# Patient Record
Sex: Male | Born: 1990 | Race: White | Hispanic: No | Marital: Married
Health system: Southern US, Community
[De-identification: ages and names within clinical notes are randomized; demographics above are authoritative.]

## PROBLEM LIST (undated history)

## (undated) DIAGNOSIS — R809 Proteinuria, unspecified: Secondary | ICD-10-CM

---
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
## 2010-11-11 ENCOUNTER — Emergency Department: Payer: Self-pay | Admitting: Emergency Medicine

## 2012-10-11 IMAGING — CT CT HEAD WITHOUT CONTRAST
2 series · 16 of 30 positions shown, 20 images · non-contrast
Comparison: none

REASON FOR EXAM: dizzy  confusion
COMMENTS:

PROCEDURE:     CT  - CT HEAD WITHOUT CONTRAST  - November 11, 2010 [DATE]
RESULT:     Comparison:  None
TECHNIQUE: Multiple axial images from the foramen magnum to the vertex were
obtained without IV contrast.

[Series 2: without · axial · non-contrast · 0.44mm/px · z∈[+417,+552]mm · 13 of 33 slices shown, 17 images]
[im 3/33  brain]
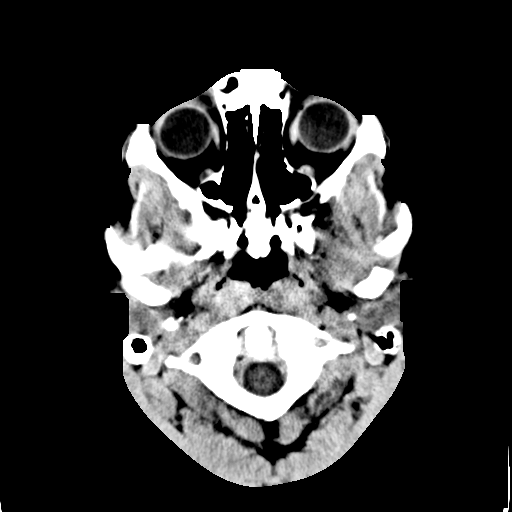
[im 3/33  bone]
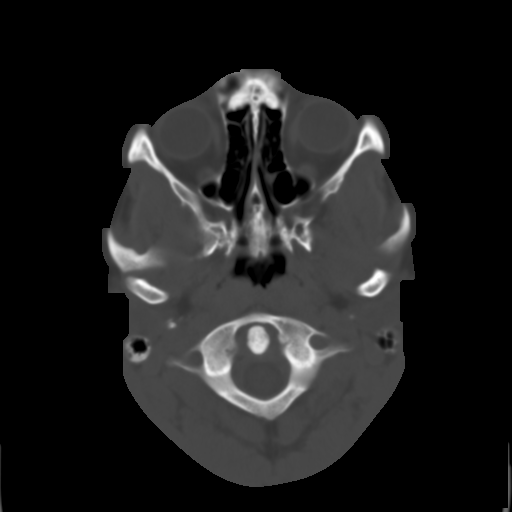
[im 5/33  brain]
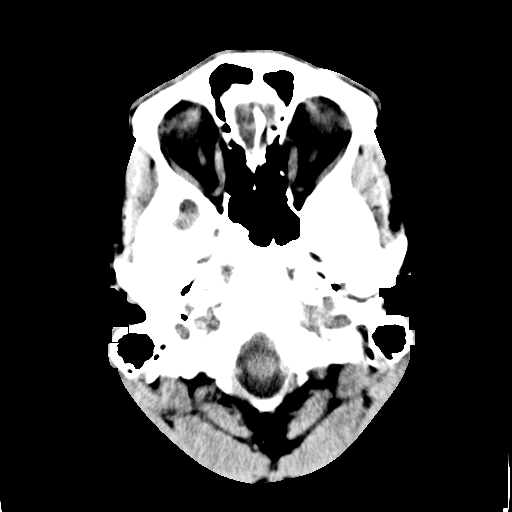
[im 7/33  brain]
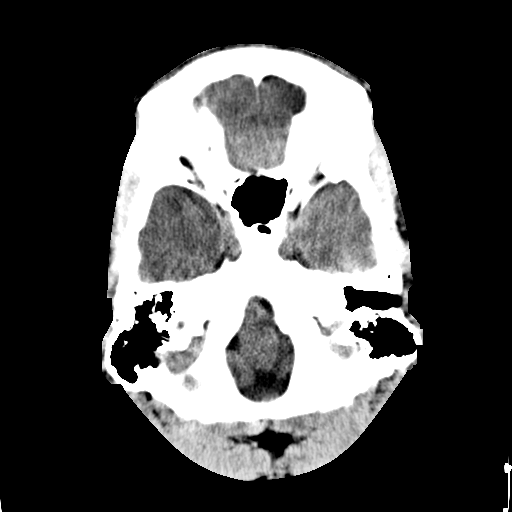
[im 10/33  brain]
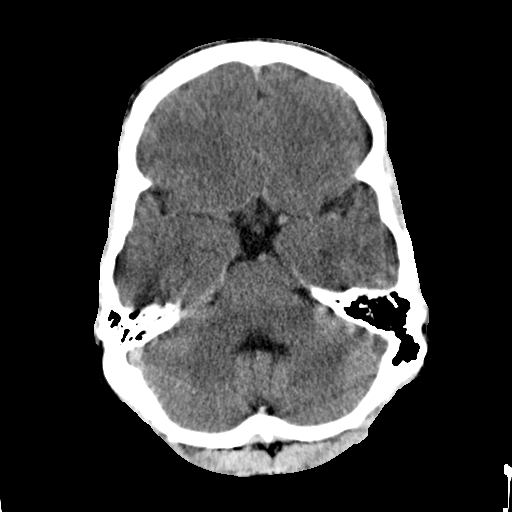
[im 12/33  brain]
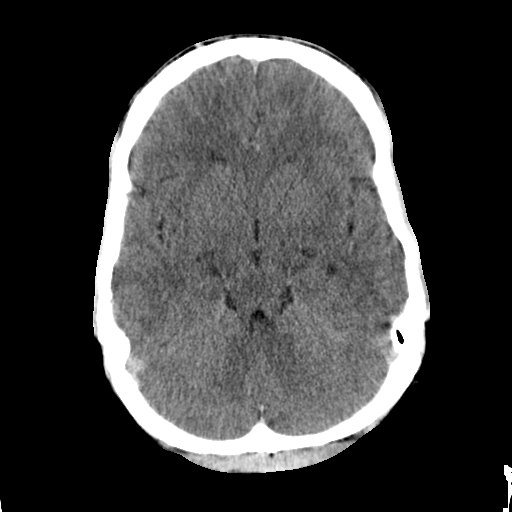
[im 12/33  bone]
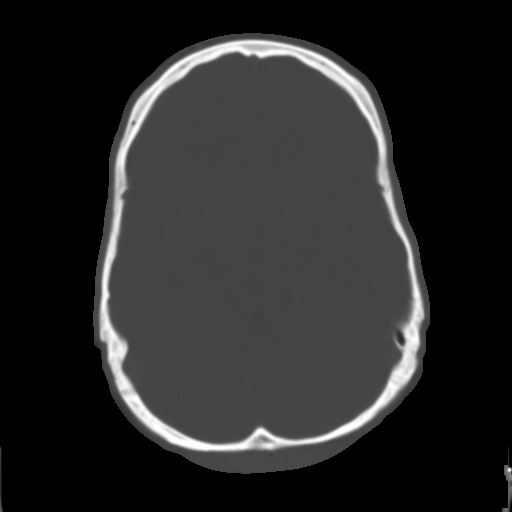
[im 14/33  brain]
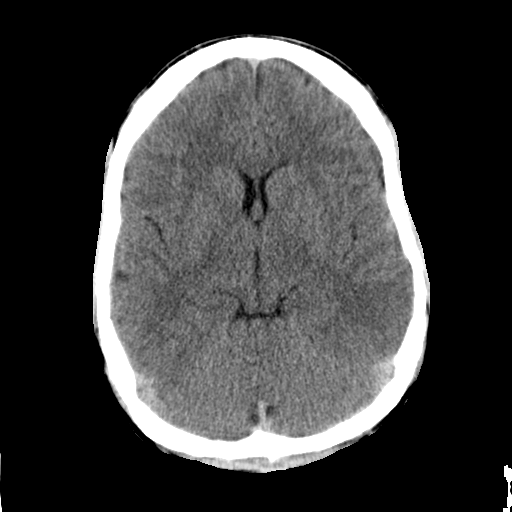
[im 17/33  brain]
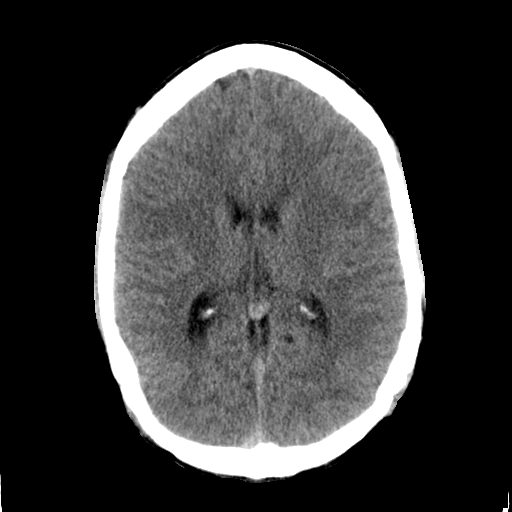
[im 19/33  brain]
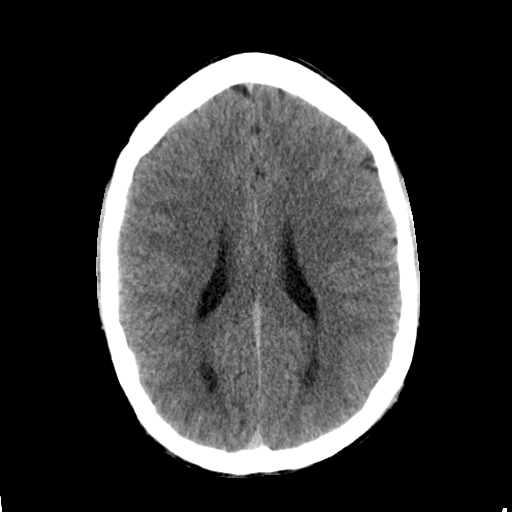
[im 21/33  brain]
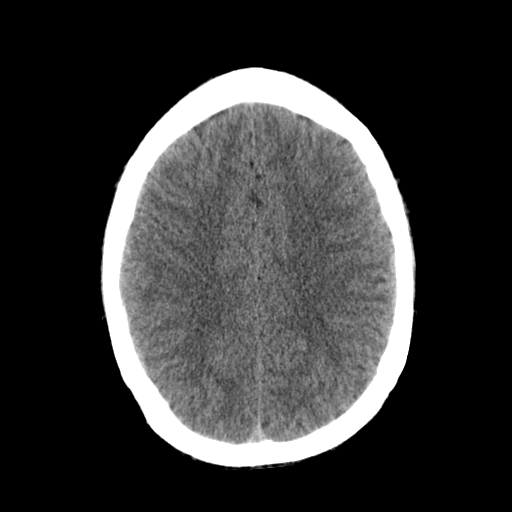
[im 21/33  bone]
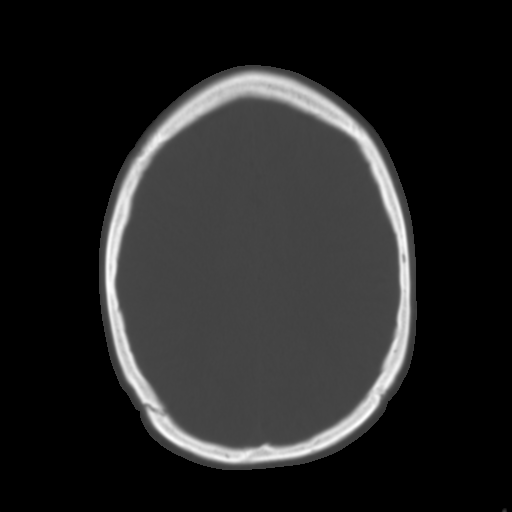
[im 23/33  brain]
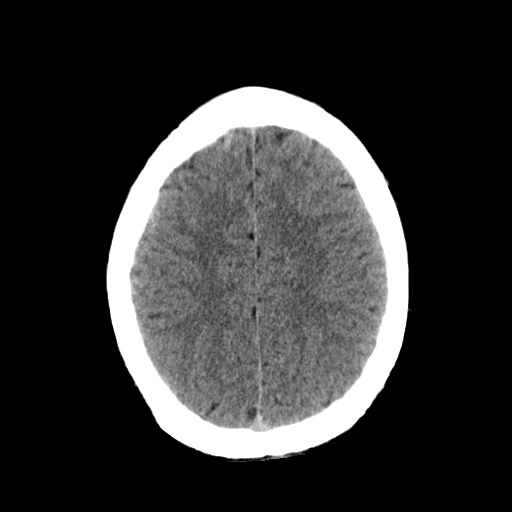
[im 26/33  brain]
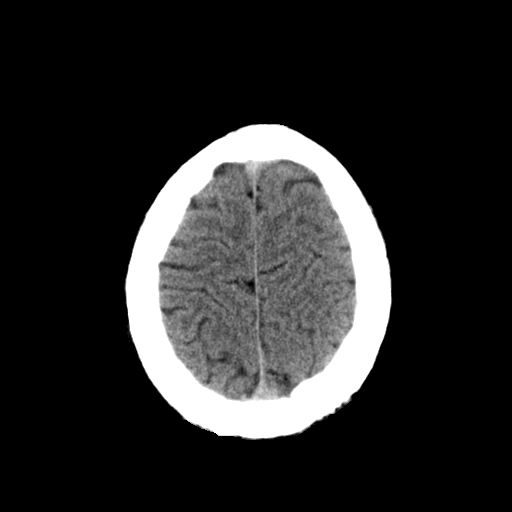
[im 28/33  brain]
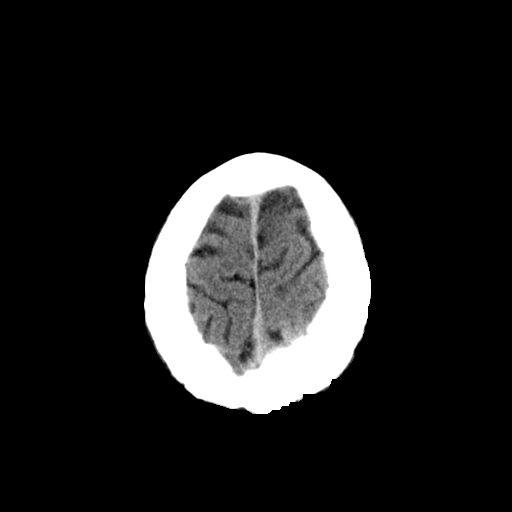
[im 30/33  brain]
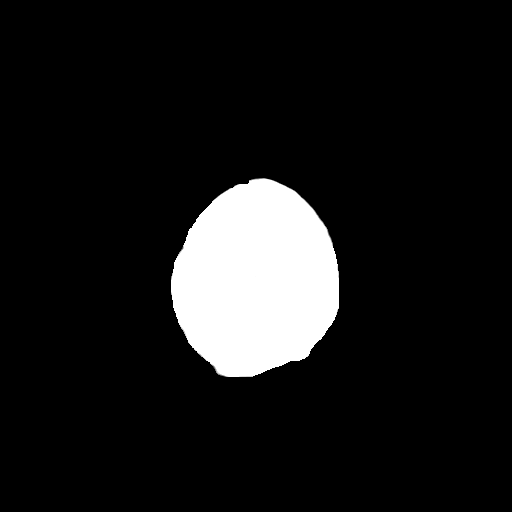
[im 30/33  bone]
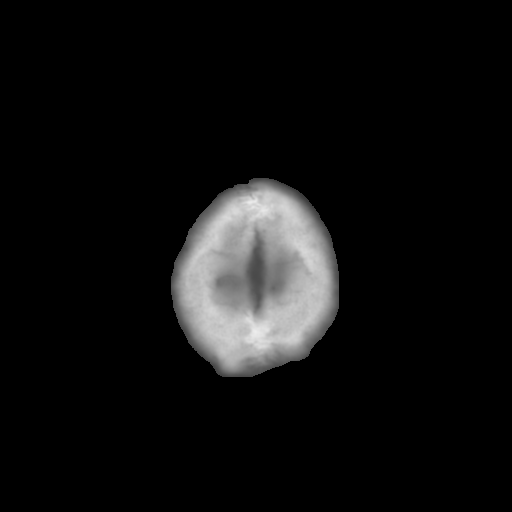

[Series 3: bone · axial · 0.44mm/px · z∈[+417,+462]mm · 3 of 33 slices shown]
[im 3/33  bone]
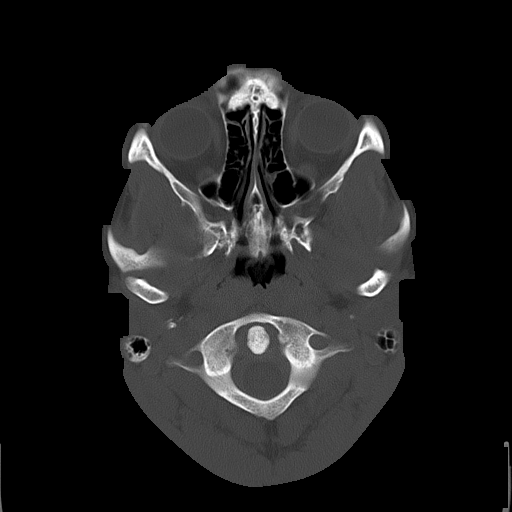
[im 7/33  bone]
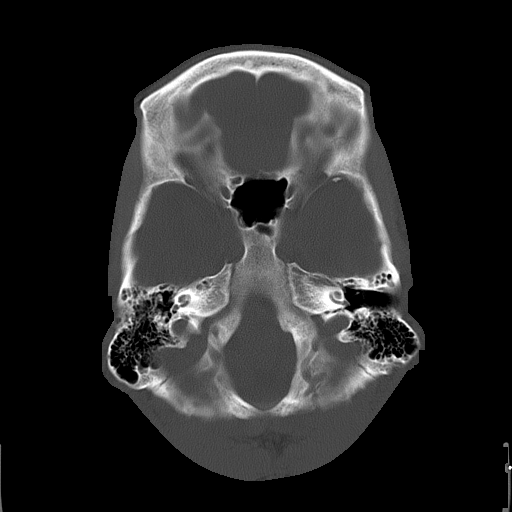
[im 12/33  bone]
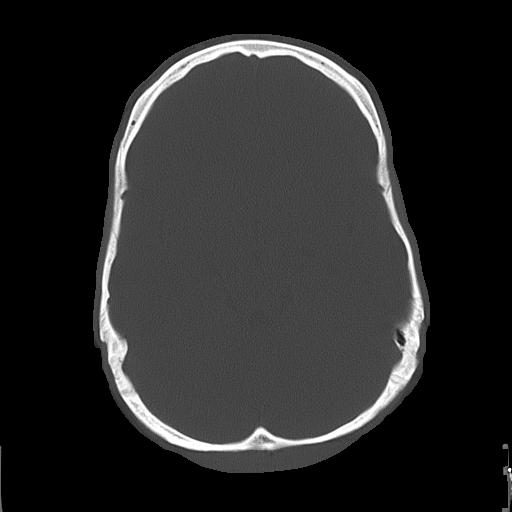

[16 of 30 positions shown; findings below may reference images not displayed]

FINDINGS: There is no evidence for mass effect, midline shift, or extra-axial fluid
collections. There is no evidence for space-occupying lesion, intracranial
hemorrhage, or cortical-based area of infarction.

The osseous structures are unremarkable.
IMPRESSION: No acute intracranial process.

## 2019-01-29 ENCOUNTER — Ambulatory Visit: Payer: Self-pay | Attending: Internal Medicine

## 2019-01-29 DIAGNOSIS — Z20822 Contact with and (suspected) exposure to covid-19: Secondary | ICD-10-CM | POA: Insufficient documentation

## 2019-01-30 LAB — NOVEL CORONAVIRUS, NAA: SARS-CoV-2, NAA: NOT DETECTED

## 2019-01-31 ENCOUNTER — Telehealth: Payer: Self-pay

## 2019-01-31 NOTE — Telephone Encounter (Signed)
Pt notified of negative COVID-19 results. Understanding verbalized.  Chasta M Hopkins   

## 2020-09-25 ENCOUNTER — Other Ambulatory Visit
Admission: RE | Admit: 2020-09-25 | Discharge: 2020-09-25 | Disposition: A | Discharge status: Home / Self Care | Payer: 59 | Attending: Nephrology | Admitting: Nephrology

## 2020-09-25 DIAGNOSIS — R809 Proteinuria, unspecified: Secondary | ICD-10-CM | POA: Diagnosis present

## 2020-09-25 DIAGNOSIS — R319 Hematuria, unspecified: Secondary | ICD-10-CM | POA: Insufficient documentation

## 2020-09-25 DIAGNOSIS — E7800 Pure hypercholesterolemia, unspecified: Secondary | ICD-10-CM | POA: Diagnosis not present

## 2020-09-25 DIAGNOSIS — Q8781 Alport syndrome: Secondary | ICD-10-CM | POA: Diagnosis not present

## 2020-09-25 DIAGNOSIS — M10071 Idiopathic gout, right ankle and foot: Secondary | ICD-10-CM | POA: Diagnosis not present

## 2020-09-25 DIAGNOSIS — Z7189 Other specified counseling: Secondary | ICD-10-CM | POA: Insufficient documentation

## 2020-09-25 LAB — ABO/RH: ABO/RH(D): O POS

## 2020-09-30 ENCOUNTER — Other Ambulatory Visit (HOSPITAL_COMMUNITY): Payer: Self-pay | Admitting: Nephrology

## 2020-09-30 ENCOUNTER — Other Ambulatory Visit: Payer: Self-pay | Admitting: Nephrology

## 2020-09-30 DIAGNOSIS — Q8781 Alport syndrome: Secondary | ICD-10-CM

## 2020-09-30 DIAGNOSIS — R809 Proteinuria, unspecified: Secondary | ICD-10-CM

## 2020-10-06 ENCOUNTER — Ambulatory Visit: Payer: 59

## 2020-10-08 ENCOUNTER — Other Ambulatory Visit: Payer: Self-pay

## 2020-10-08 ENCOUNTER — Ambulatory Visit
Admission: RE | Admit: 2020-10-08 | Discharge: 2020-10-08 | Disposition: A | Discharge status: Home / Self Care | Payer: 59 | Source: Ambulatory Visit | Attending: Nephrology | Admitting: Nephrology

## 2020-10-08 DIAGNOSIS — R809 Proteinuria, unspecified: Secondary | ICD-10-CM | POA: Insufficient documentation

## 2020-10-08 DIAGNOSIS — Q8781 Alport syndrome: Secondary | ICD-10-CM | POA: Insufficient documentation

## 2020-10-13 ENCOUNTER — Other Ambulatory Visit: Payer: Self-pay | Admitting: Nephrology

## 2020-10-13 DIAGNOSIS — Q8781 Alport syndrome: Secondary | ICD-10-CM

## 2020-10-13 DIAGNOSIS — R809 Proteinuria, unspecified: Secondary | ICD-10-CM

## 2020-10-22 ENCOUNTER — Other Ambulatory Visit: Payer: Self-pay | Admitting: Radiology

## 2020-10-23 ENCOUNTER — Other Ambulatory Visit: Payer: Self-pay

## 2020-10-23 ENCOUNTER — Ambulatory Visit: Payer: 59

## 2020-10-23 ENCOUNTER — Ambulatory Visit
Admission: RE | Admit: 2020-10-23 | Discharge: 2020-10-23 | Disposition: A | Discharge status: Home / Self Care | Payer: 59 | Source: Ambulatory Visit | Attending: Nephrology | Admitting: Nephrology

## 2020-10-23 DIAGNOSIS — R809 Proteinuria, unspecified: Secondary | ICD-10-CM | POA: Insufficient documentation

## 2020-10-23 DIAGNOSIS — Z881 Allergy status to other antibiotic agents status: Secondary | ICD-10-CM | POA: Insufficient documentation

## 2020-10-23 DIAGNOSIS — Q8781 Alport syndrome: Secondary | ICD-10-CM | POA: Insufficient documentation

## 2020-10-23 HISTORY — DX: Proteinuria, unspecified: R80.9

## 2020-10-23 LAB — CBC
HCT: 40.2 % (ref 39.0–52.0)
Hemoglobin: 14.4 g/dL (ref 13.0–17.0)
MCH: 30.6 pg (ref 26.0–34.0)
MCHC: 35.8 g/dL (ref 30.0–36.0)
MCV: 85.5 fL (ref 80.0–100.0)
Platelets: 199 10*3/uL (ref 150–400)
RBC: 4.7 MIL/uL (ref 4.22–5.81)
RDW: 12.5 % (ref 11.5–15.5)
WBC: 6 10*3/uL (ref 4.0–10.5)
nRBC: 0 % (ref 0.0–0.2)

## 2020-10-23 LAB — PROTIME-INR
INR: 1 (ref 0.8–1.2)
Prothrombin Time: 13.2 seconds (ref 11.4–15.2)

## 2020-10-23 MED ORDER — MIDAZOLAM HCL 2 MG/2ML IJ SOLN
INTRAMUSCULAR | Status: AC
  Filled 2020-10-23: qty 2

## 2020-10-23 MED ORDER — FENTANYL CITRATE (PF) 100 MCG/2ML IJ SOLN
INTRAMUSCULAR | Status: DC | PRN
  Administered 2020-10-23: 50 ug via INTRAVENOUS

## 2020-10-23 MED ORDER — MIDAZOLAM HCL 2 MG/2ML IJ SOLN
INTRAMUSCULAR | Status: DC | PRN
  Administered 2020-10-23: 1 mg via INTRAVENOUS

## 2020-10-23 MED ORDER — MIDAZOLAM HCL 5 MG/5ML IJ SOLN
INTRAMUSCULAR | Status: DC | PRN
  Administered 2020-10-23: 1 mg via INTRAVENOUS

## 2020-10-23 MED ORDER — SODIUM CHLORIDE 0.9 % IV SOLN: INTRAVENOUS | Status: DC

## 2020-10-23 MED ORDER — FENTANYL CITRATE (PF) 100 MCG/2ML IJ SOLN
INTRAMUSCULAR | Status: AC
  Filled 2020-10-23: qty 2

## 2020-10-23 NOTE — Procedures (Addendum)
Interventional Radiology Procedure Note  Date of Procedure: 10/23/2020  Procedure: US guided random renal biopsy   Findings:  1. Random renal biopsy, LEFT kidney, 16ga x2 passes   2. Gelfoam slurry administered for hemostasis   Complications: No immediate complications noted.   Estimated Blood Loss: minimal  Follow-up and Recommendations: 1. Bedrest 3 hours    Olive Bass, MD  Vascular & Interventional Radiology  10/23/2020 8:40 AM

## 2020-10-23 NOTE — Progress Notes (Signed)
Patient tolerating po water intake without complaint of nausea. Will continue to monitor. VS WDL at this time.

## 2020-10-23 NOTE — H&P (Signed)
Interventional Radiology - Preprocedure H&P    Referring Provider: Mosetta Pigeon, MD  History of Present Illness  Jason Tucker is a 30 y.o. male with a relevant past medical history of proteinuria seen today for US guided random renal biopsy.  He is in his usual state of health. No other medical problems.     Additional Past Medical History Past Medical History:  Diagnosis Date   Proteinuria, unspecified     Medications  I have reviewed the current medication list. Refer to chart for details. No current outpatient medications    Allergies Allergies  Allergen Reactions   Doxycycline     Patient states he had it as a baby but doesn't recall what happened when he took it    Physical Exam Current Vitals Temp: 98.3 F (36.8 C) (Temp Source: Oral)  Pulse Rate: 64  Resp: 15  BP: 129/76  SpO2: 100 %        There is no height or weight on file to calculate BMI.  General: Alert and answers questions appropriately. No apparent distress. HEENT: Normocephalic, atraumatic. Conjunctivae normal without scleral icterus. Mallampati score: I (soft palate, uvula, fauces, and tonsillar pillars visible) Cardiac: Regular rate and rhythm. No dependent edema. Pulmonary: Normal work of breathing. On room air. Abdominal: Soft without distension. Extremities: Normally-formed, well perfused.    Pertinent Lab Results Labs: CBC:    BMP:   Coagulation: PT/INR/PTT:  --/1.0/-- (10/07 0742)  CBC Trends: No results for input(s): WBC, HGB, HCT, PLT in the last 72 hours.   Creatinine Trend: No results for input(s): CREATININE in the last 72 hours.   Relevant and/or Recent Imaging: Recent renal US reviewed    Assessment & Plan Jason Tucker is a 30 y.o. male seen today for US guided renal biopsy in the setting of proteinuria.  He is appropriate for procedure and agreeable to proceed after discussion of risks and benefits, including bleeding after the procedure.    Procedure  Checklist:  Consent obtained: Risks of the procedure as well as the alternatives and risks of each were explained to the patient and/or caregiver.  Consent for the procedure was obtained and is signed in the bedside chart Consent obtained from: The patient Patient is appropriate candidate for sedation Yes ASA Classification: ASA 1 - Normal health patient NPO status: 0000  Code status:   Code Status: Not on file Pre-procedural prep necessary: none      I spent a total of 15 Minutes in face-to-face in clinical consultation, greater than 50% of which was spent on medical decision-making and counseling/coordinating care for kidney biopsy.     Olive Bass, MD  Vascular and Interventional Radiology 10/23/2020 8:07 AM

## 2020-10-30 ENCOUNTER — Ambulatory Visit: Payer: 59

## 2020-11-05 ENCOUNTER — Encounter: Payer: Self-pay | Admitting: Nephrology

## 2020-11-05 LAB — SURGICAL PATHOLOGY

## 2021-05-11 DIAGNOSIS — R809 Proteinuria, unspecified: Secondary | ICD-10-CM | POA: Diagnosis not present

## 2021-05-11 DIAGNOSIS — N041 Nephrotic syndrome with focal and segmental glomerular lesions: Secondary | ICD-10-CM | POA: Diagnosis not present

## 2021-05-11 DIAGNOSIS — E790 Hyperuricemia without signs of inflammatory arthritis and tophaceous disease: Secondary | ICD-10-CM | POA: Diagnosis not present

## 2021-05-11 DIAGNOSIS — Q8781 Alport syndrome: Secondary | ICD-10-CM | POA: Diagnosis not present

## 2021-05-11 DIAGNOSIS — E785 Hyperlipidemia, unspecified: Secondary | ICD-10-CM | POA: Diagnosis not present

## 2021-06-09 DIAGNOSIS — Q8781 Alport syndrome: Secondary | ICD-10-CM | POA: Diagnosis not present

## 2021-06-09 DIAGNOSIS — R030 Elevated blood-pressure reading, without diagnosis of hypertension: Secondary | ICD-10-CM | POA: Diagnosis not present

## 2021-06-09 DIAGNOSIS — E7800 Pure hypercholesterolemia, unspecified: Secondary | ICD-10-CM | POA: Diagnosis not present

## 2021-07-09 DIAGNOSIS — K529 Noninfective gastroenteritis and colitis, unspecified: Secondary | ICD-10-CM | POA: Diagnosis not present

## 2021-07-09 DIAGNOSIS — R197 Diarrhea, unspecified: Secondary | ICD-10-CM | POA: Diagnosis not present

## 2022-01-05 DIAGNOSIS — E785 Hyperlipidemia, unspecified: Secondary | ICD-10-CM | POA: Diagnosis not present

## 2022-01-05 DIAGNOSIS — I10 Essential (primary) hypertension: Secondary | ICD-10-CM | POA: Diagnosis not present

## 2022-09-01 DIAGNOSIS — M10071 Idiopathic gout, right ankle and foot: Secondary | ICD-10-CM | POA: Diagnosis not present

## 2022-09-01 DIAGNOSIS — E7800 Pure hypercholesterolemia, unspecified: Secondary | ICD-10-CM | POA: Diagnosis not present

## 2022-09-08 IMAGING — US US RENAL
1 series · 14 of 25 positions shown · non-contrast
Comparison: Prior ultrasound from 04/09/2004.

CLINICAL DATA: Initial evaluation for proteinuria, history of
Alport syndrome.

EXAM:
RENAL / URINARY TRACT ULTRASOUND COMPLETE

[Series 1: us renal · 0.23mm/px · 14 of 27 slices shown]
[im 1/27]
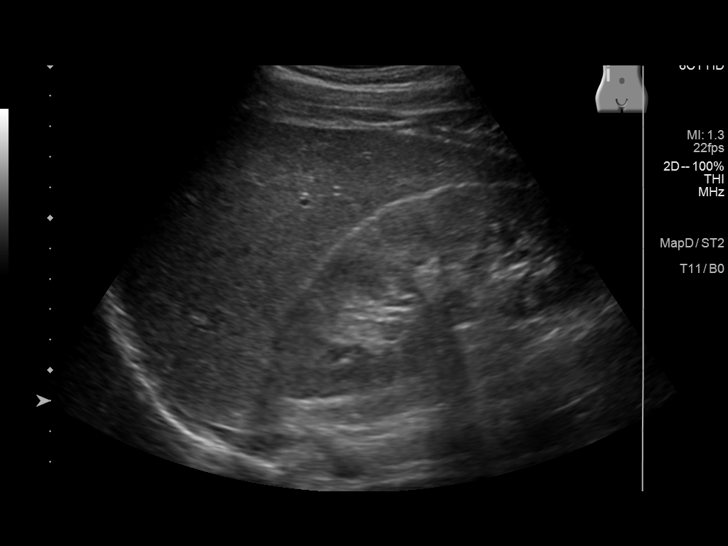
[im 3/27]
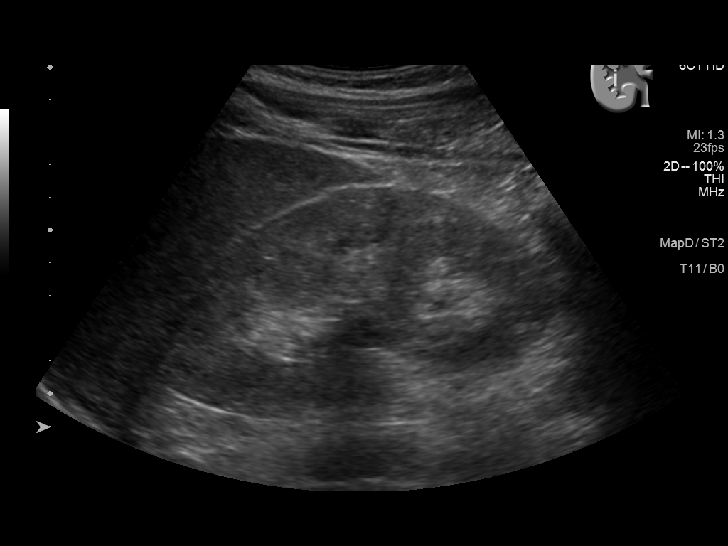
[im 5/27]
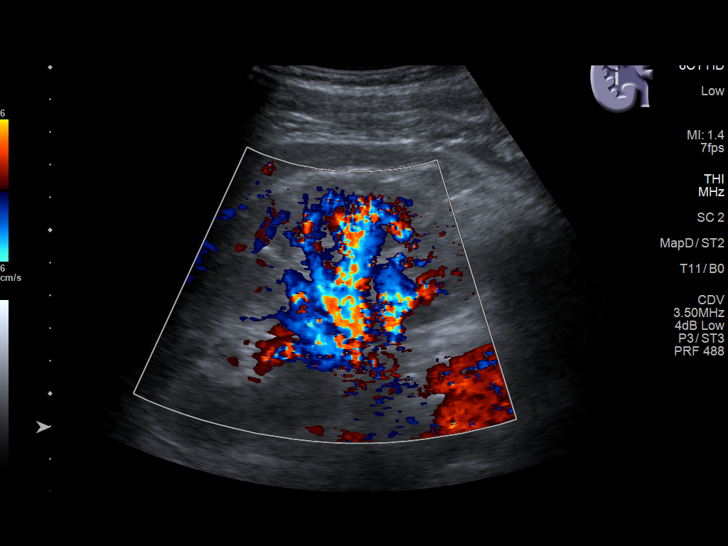
[im 7/27]
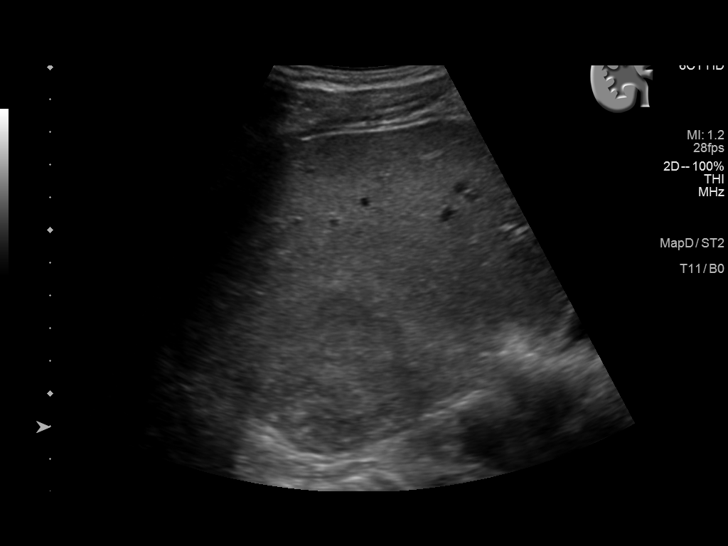
[im 9/27]
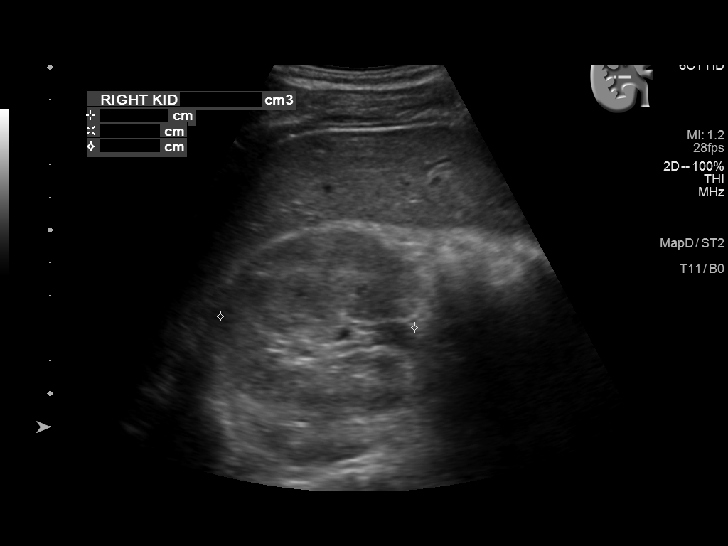
[im 10/27]
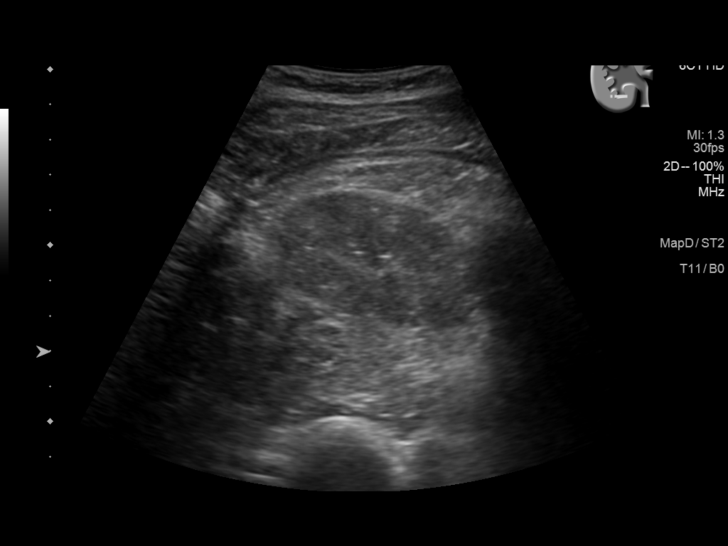
[im 12/27]
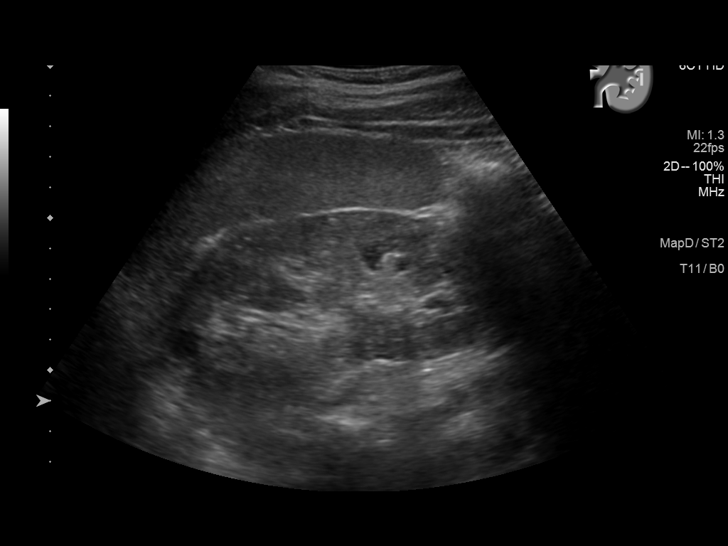
[im 15/27]
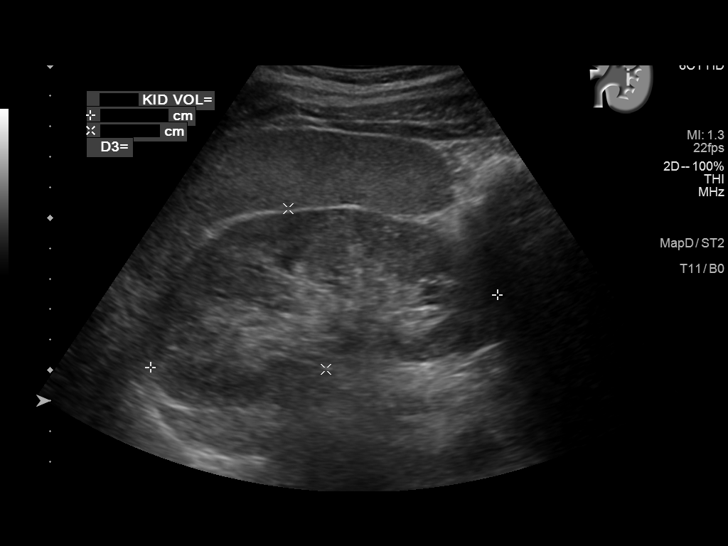
[im 17/27]
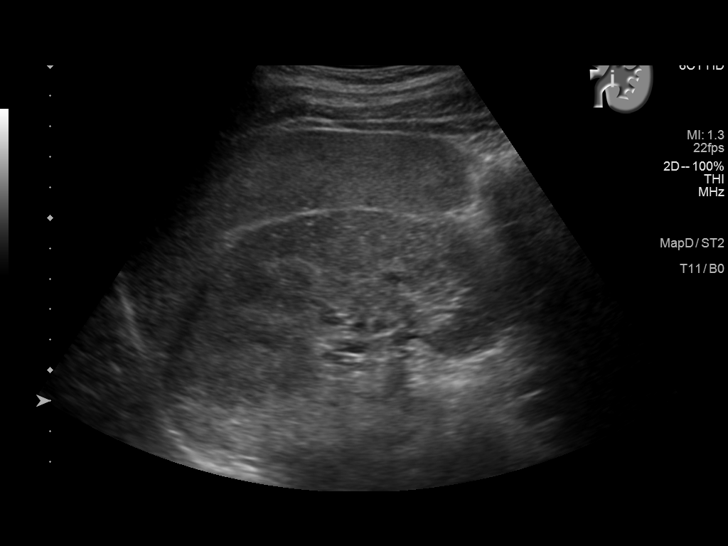
[im 18/27]
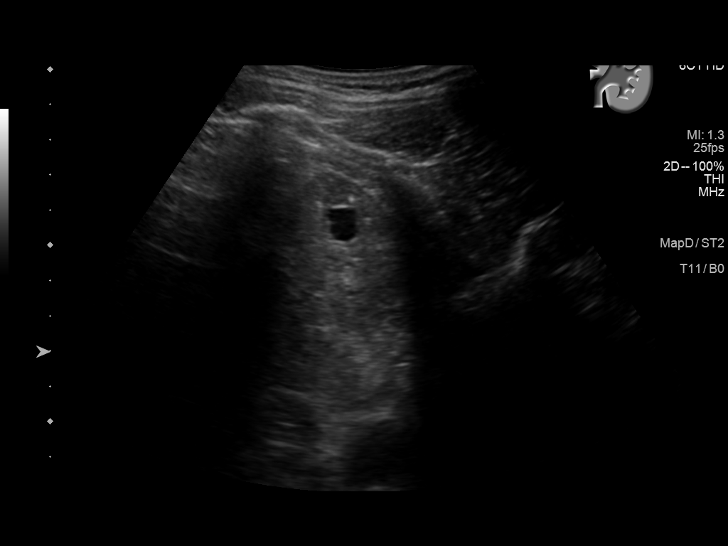
[im 20/27]
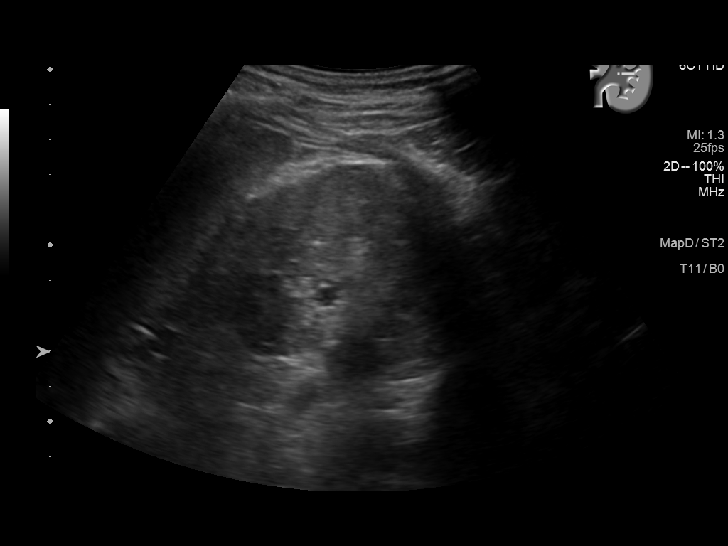
[im 22/27]
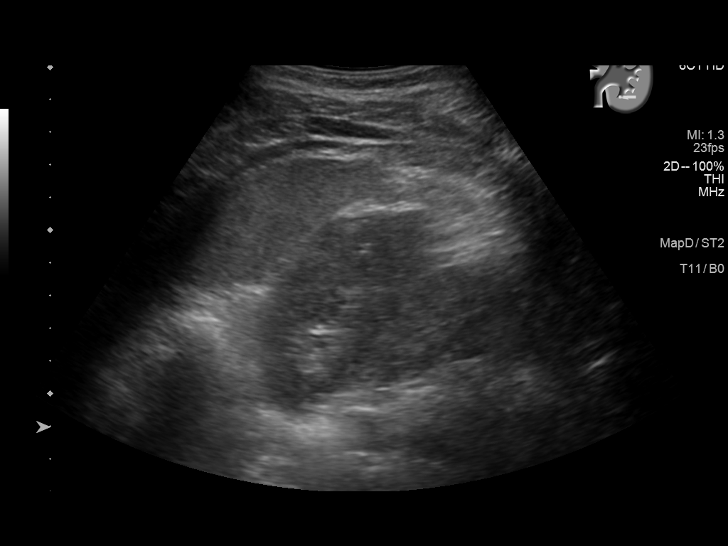
[im 24/27]
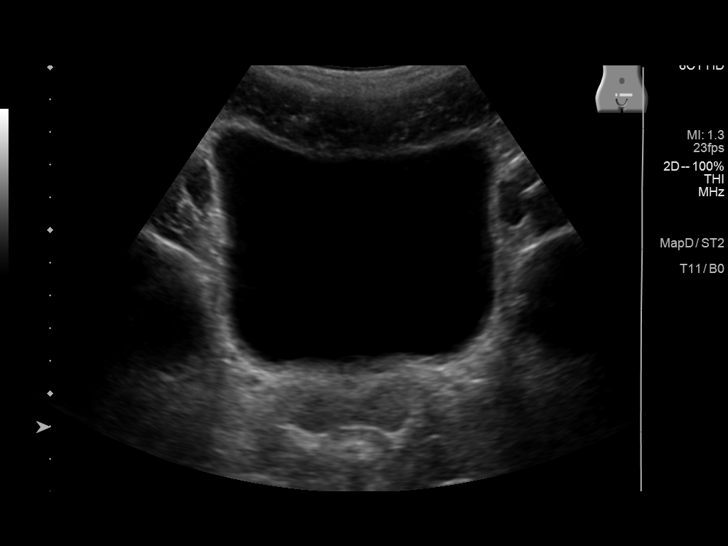
[im 27/27]
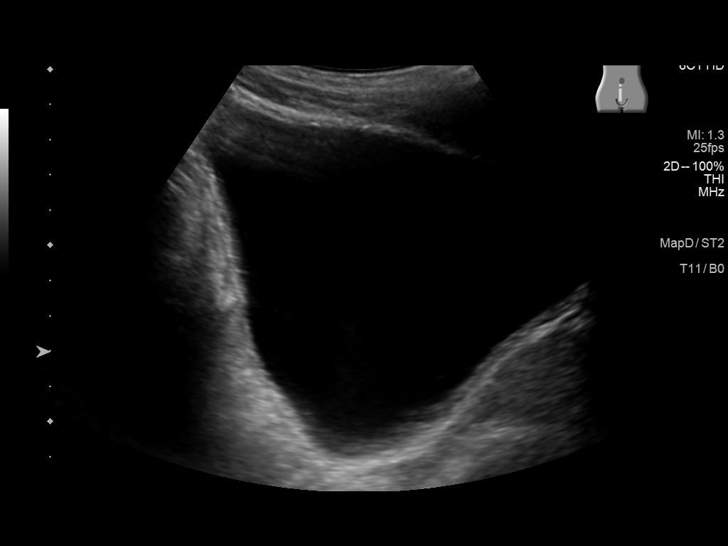

[14 of 25 positions shown; findings below may reference images not displayed]

FINDINGS: Right Kidney:

Renal measurements: 12.0 x 5.1 x 6.0 cm = volume: 189 mL. Diffusely
increased echogenicity within the renal parenchyma. No
nephrolithiasis or hydronephrosis. No focal renal mass.

Left Kidney:

Renal measurements: 11.6 x 5.4 x 5.6 cm = volume: 185 mL. Diffusely
increased echogenicity within the renal parenchyma. No
nephrolithiasis or hydronephrosis. 1.3 cm simple cyst present at the
upper pole.

Bladder:

Appears normal for degree of bladder distention.

Other:

None.
IMPRESSION: 1. Diffusely increased echogenicity within the renal parenchyma,
consistent with medical renal disease.
2. 1.3 cm simple cyst at the upper pole of the left kidney.
3. No nephrolithiasis or hydronephrosis.

## 2022-09-23 IMAGING — US US BIOPSY
2 series · 7 of 7 positions shown · non-contrast
Comparison: none

INDICATION: Alport syndrome, proteinuria

[Series 1: us biopsy · 0.19mm/px · 6 of 6 slices shown (1 of 2)]
[im 1/6]
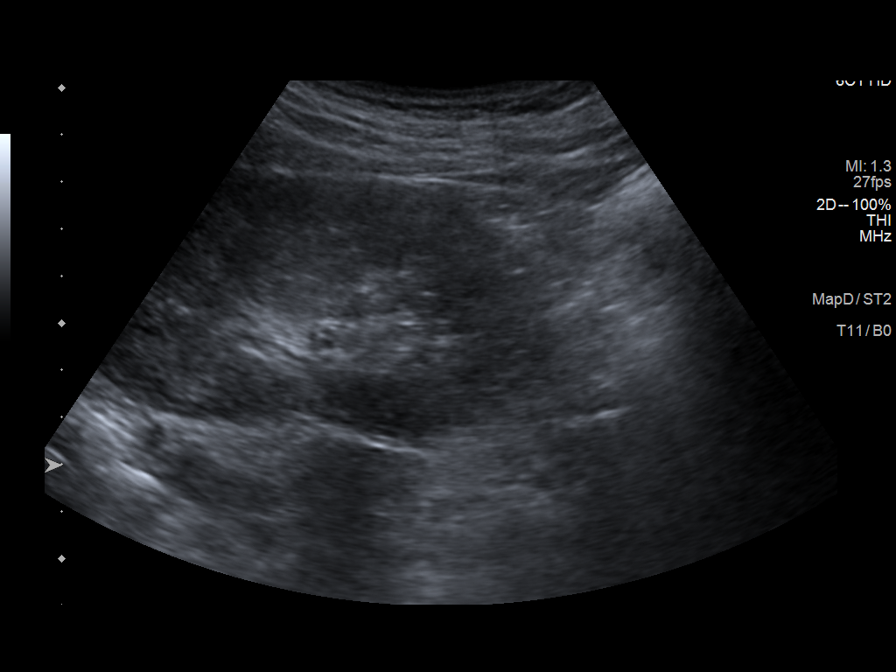
[im 2/6]
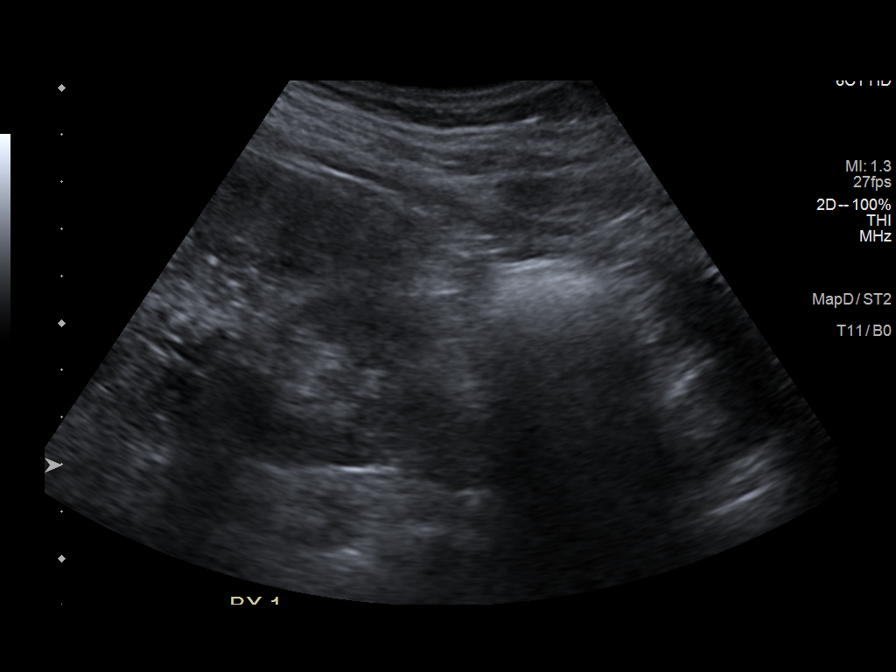
[im 3/6]
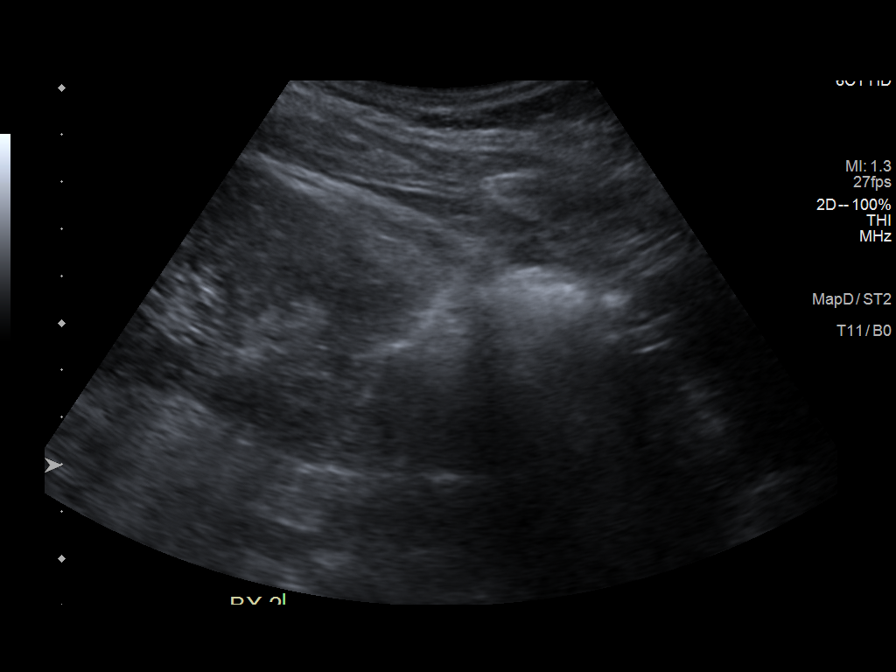
[im 4/6]
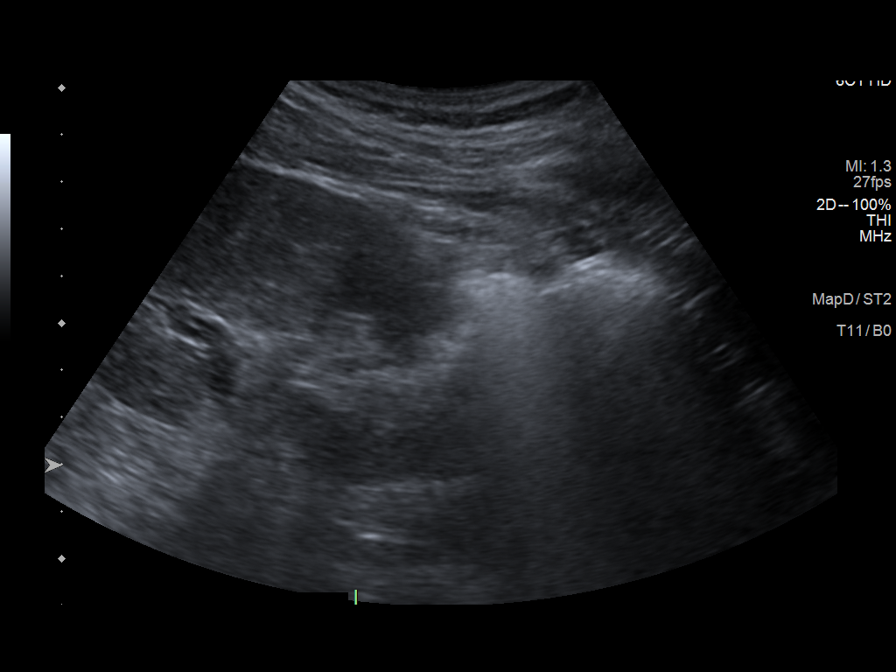
[im 5/6]
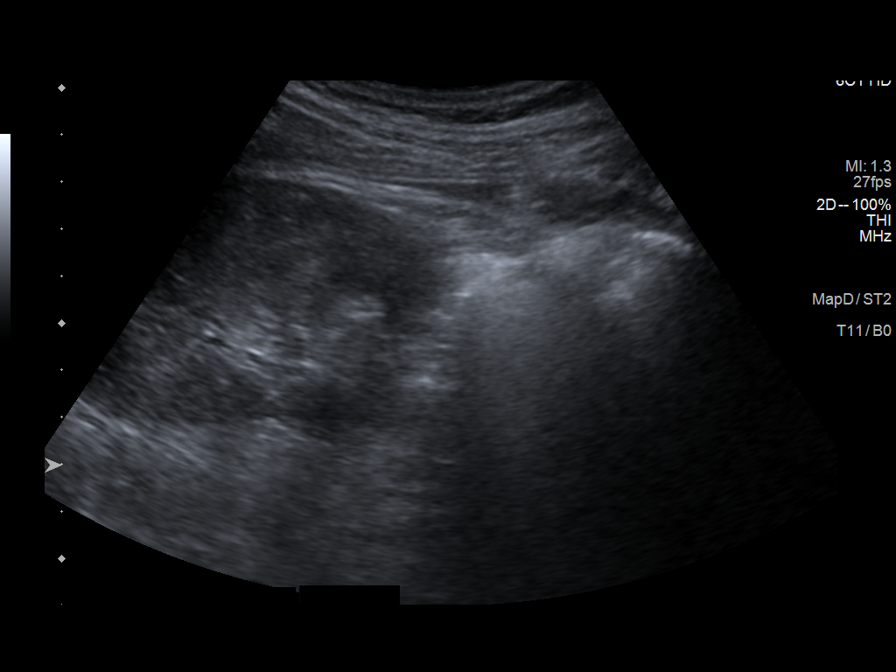
[im 6/6]
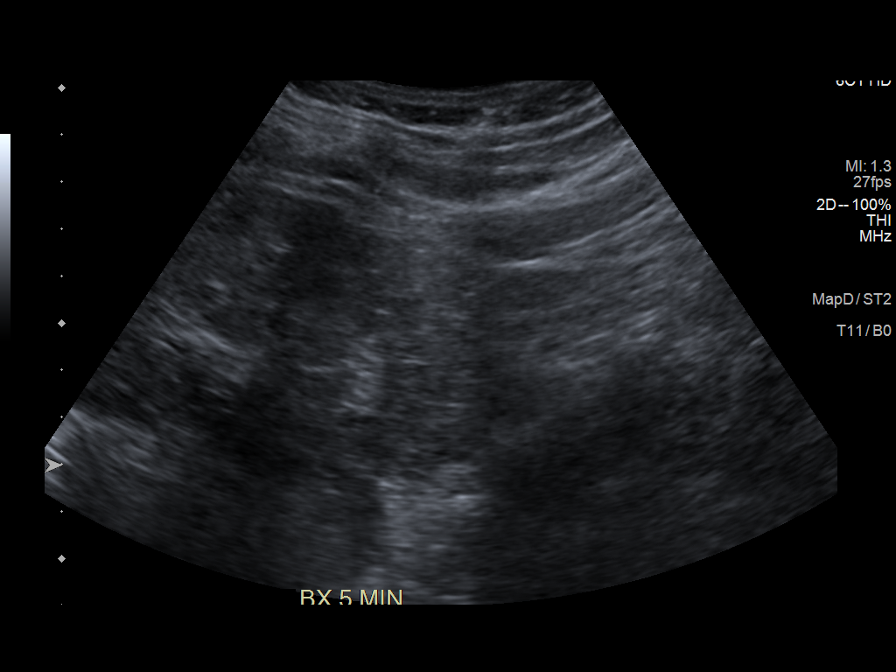

[Series 2: us biopsy · 0.23mm/px · 1 of 1 slices shown (2 of 2)]
[im 1/1]
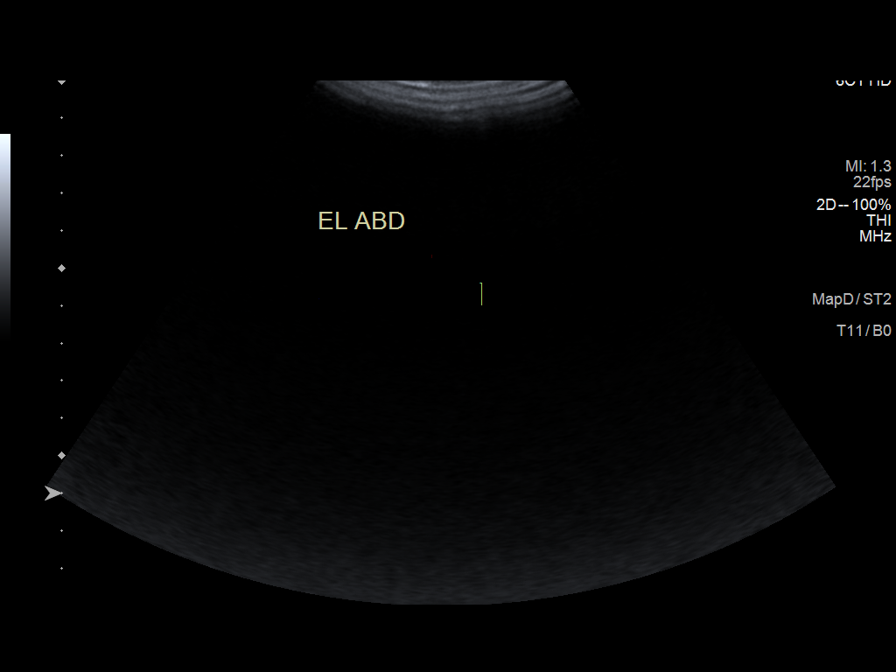

[7 of 7 positions shown; findings below may reference images not displayed]

EXAM:
Ultrasound-guided random renal biopsy, left kidney

MEDICATIONS:
None.

ANESTHESIA/SEDATION:
Moderate (conscious) sedation was employed during this procedure. A
total of Versed 2 mg and Fentanyl 50 mcg was administered
intravenously.

Moderate Sedation Time: 12 minutes. The patient's level of
consciousness and vital signs were monitored continuously by
radiology nursing throughout the procedure under my direct
supervision.

FLUOROSCOPY TIME:  N/a

COMPLICATIONS:
None immediate.

PROCEDURE:
Informed written consent was obtained from the patient after a
thorough discussion of the procedural risks, benefits and
alternatives. All questions were addressed. Maximal Sterile Barrier
Technique was utilized including caps, mask, sterile gowns, sterile
gloves, sterile drape, hand hygiene and skin antiseptic. A timeout
was performed prior to the initiation of the procedure.

The patient was placed prone on the exam table. Ultrasound of the
bilateral flanks was performed. The lower pole the left kidney was
selected for biopsy. Skin entry site was marked, the overlying skin
was prepped and draped in the standard sterile fashion. Local
analgesia was obtained with 1% lidocaine. Using ultrasound guidance,
a 15 gauge introducer needle was advanced directly into the left
renal lower pole just deep to the renal cortex. Subsequently, core
needle biopsy was performed of the left renal lower pole using a 16
gauge core biopsy device x2 passes. Specimens were submitted in
saline to pathology for further handling. Gel-Foam slurry was
administered through the introducer needle as it was removed.
Limited postprocedure imaging demonstrated expected post biopsy
changes without surrounding hematoma. Limited delayed [HOSPITAL] 5
minutes demonstrated no surrounding hematoma. A clean dressing was
placed. The patient tolerated the procedure well, and was
transferred to recovery in stable condition.
IMPRESSION: Successful ultrasound-guided nonfocal core needle biopsy of the left
renal lower pole.
